# Patient Record
Sex: Male | Born: 1945 | Race: Black or African American | Hispanic: No | Marital: Married
Health system: Southern US, Community
[De-identification: ages and names within clinical notes are randomized; demographics above are authoritative.]

## PROBLEM LIST (undated history)

## (undated) DIAGNOSIS — I10 Essential (primary) hypertension: Secondary | ICD-10-CM

---

## 2004-06-10 ENCOUNTER — Emergency Department (HOSPITAL_COMMUNITY): Admission: EM | Admit: 2004-06-10 | Discharge: 2004-06-10 | Payer: Self-pay | Admitting: Emergency Medicine

## 2005-03-22 IMAGING — CT CT HEAD W/O CM
2 of 3 series · 14 of 33 positions shown, 18 images · non-contrast
Comparison: none

CLINICAL DATA: Syncopal episode.
 HEAD CT WITHOUT CONTRAST
 There is no evidence of intracranial hemorrhage, brain edema, or mass effect.  The ventricles are normal.  No extra-axial abnormalities are identified.  Bone windows show no significant abnormalities.
 IMPRESSION
 Negative non-contrast cranial CT.

[Series 2: — · sagittal · 0.44mm/px · 1 of 3 slices shown (1 of 2)]
[im 2/3  brain]
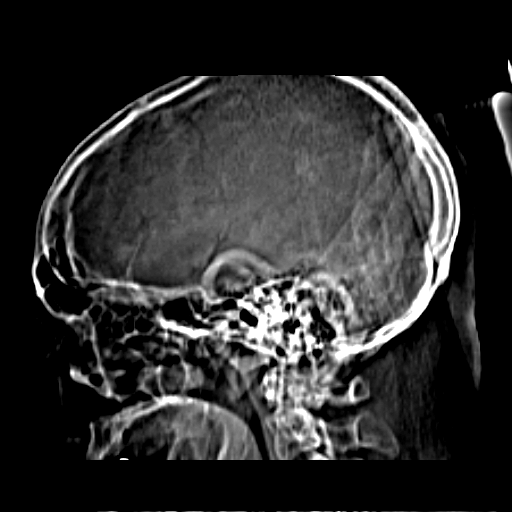

[Series 3: — · axial · 0.43mm/px · z∈[-146,-26]mm · 13 of 30 slices shown, 17 images (2 of 2)]
[im 3/30  brain]
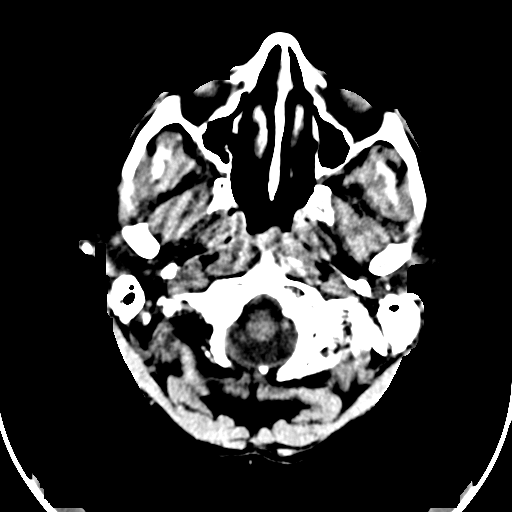
[im 3/30  bone]
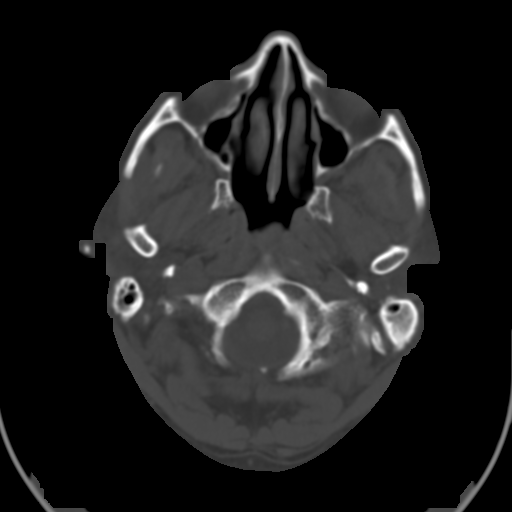
[im 5/30  brain]
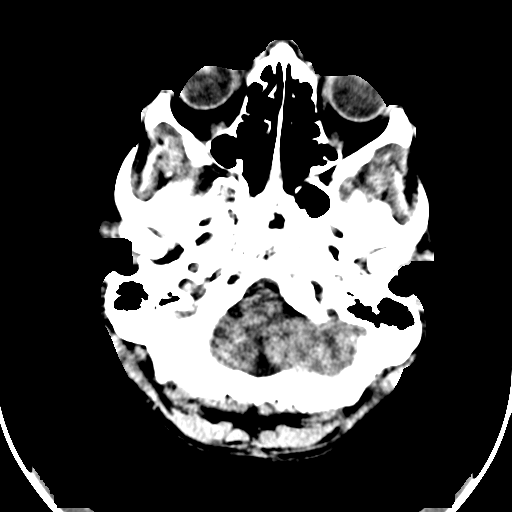
[im 7/30  brain]
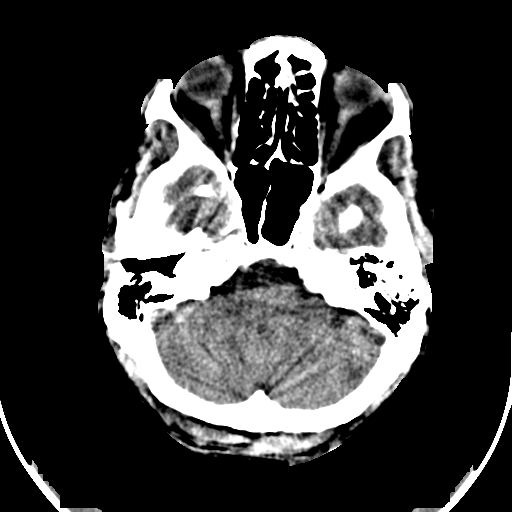
[im 9/30  brain]
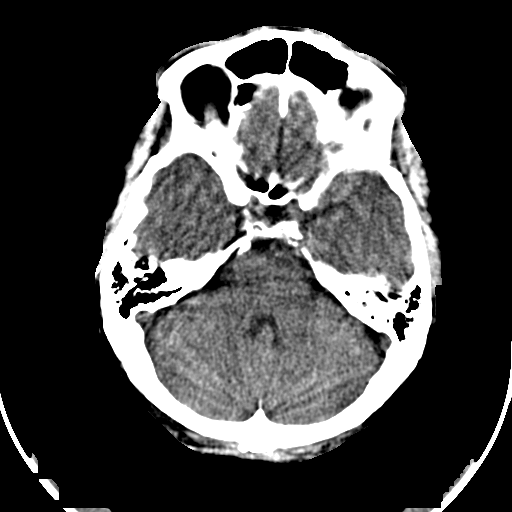
[im 11/30  brain]
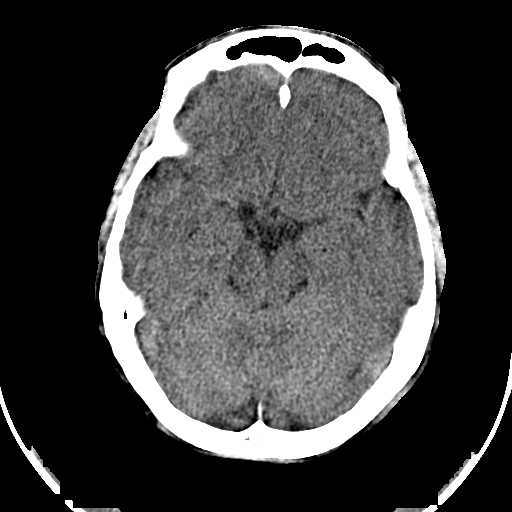
[im 11/30  bone]
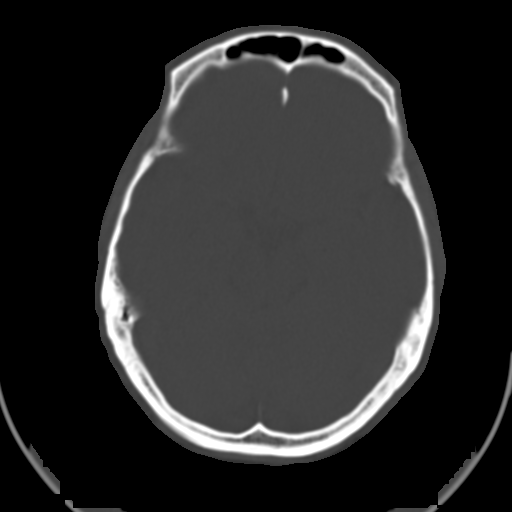
[im 13/30  brain]
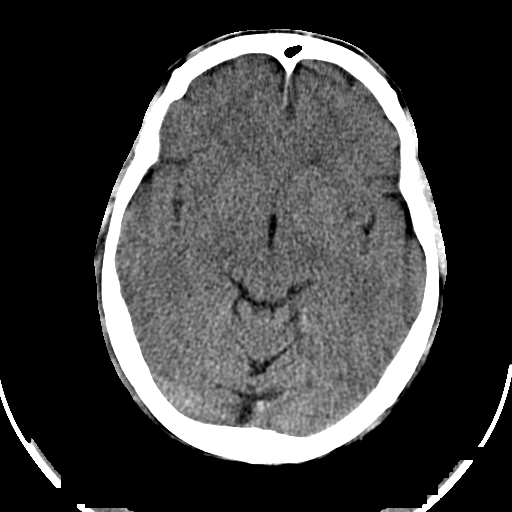
[im 15/30  brain]
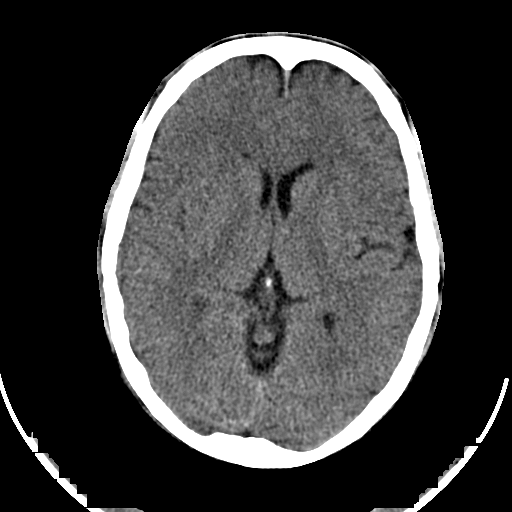
[im 17/30  brain]
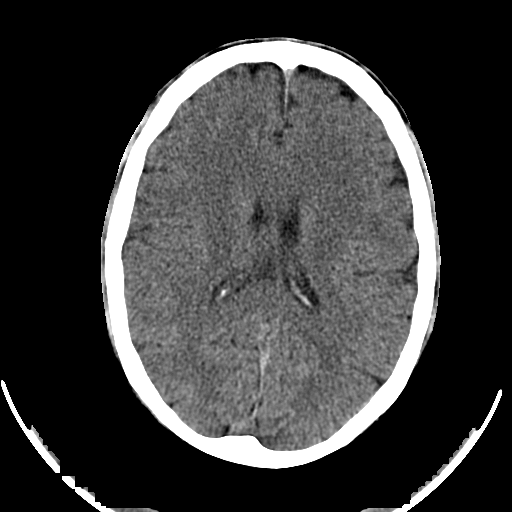
[im 19/30  brain]
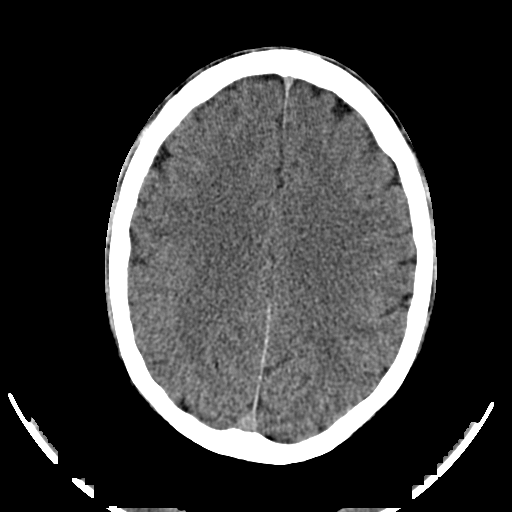
[im 19/30  bone]
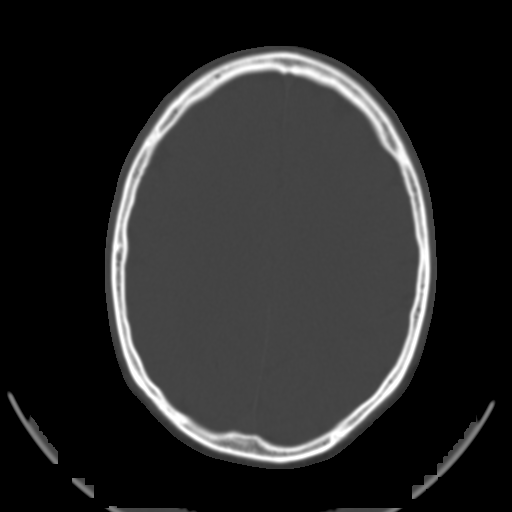
[im 21/30  brain]
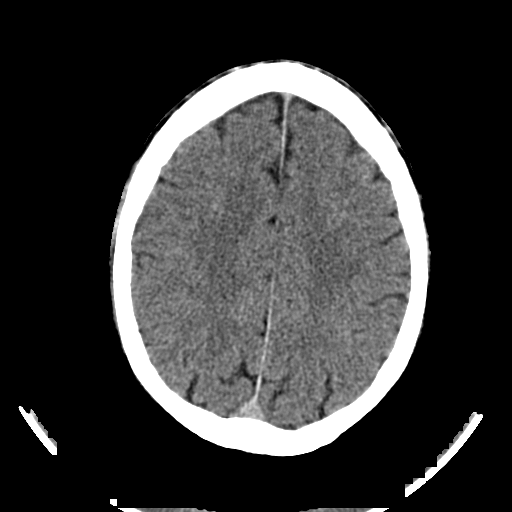
[im 23/30  brain]
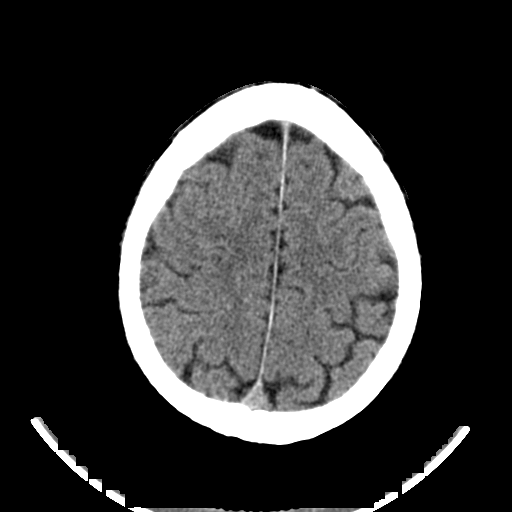
[im 25/30  brain]
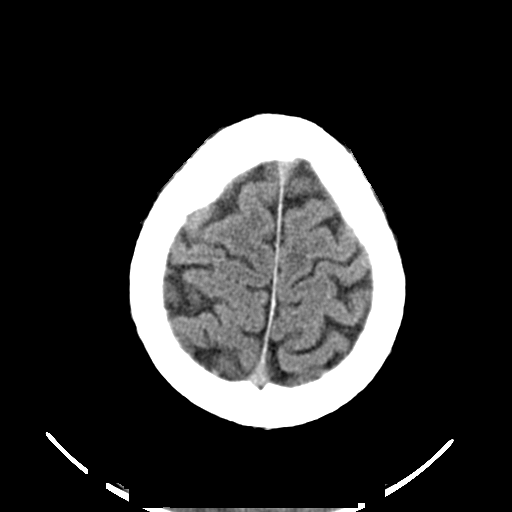
[im 27/30  brain]
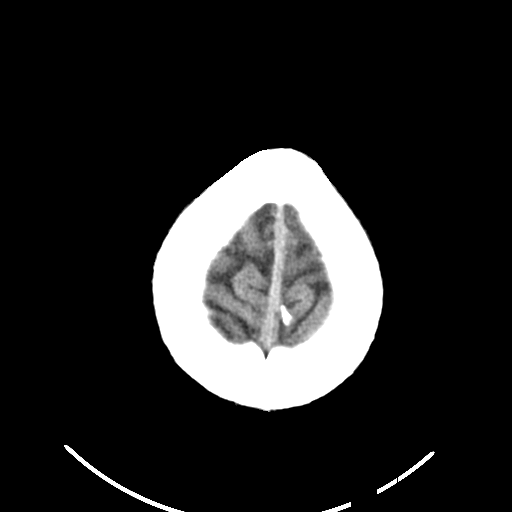
[im 27/30  bone]
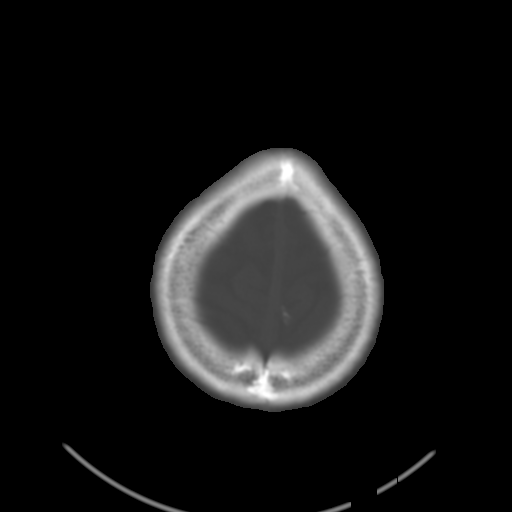

[14 of 33 positions shown; findings below may reference images not displayed]

## 2015-11-21 DIAGNOSIS — H401131 Primary open-angle glaucoma, bilateral, mild stage: Secondary | ICD-10-CM | POA: Diagnosis not present

## 2015-12-20 DIAGNOSIS — J301 Allergic rhinitis due to pollen: Secondary | ICD-10-CM | POA: Diagnosis not present

## 2015-12-20 DIAGNOSIS — R05 Cough: Secondary | ICD-10-CM | POA: Diagnosis not present

## 2016-01-10 DIAGNOSIS — H401130 Primary open-angle glaucoma, bilateral, stage unspecified: Secondary | ICD-10-CM | POA: Diagnosis not present

## 2016-04-10 DIAGNOSIS — H401131 Primary open-angle glaucoma, bilateral, mild stage: Secondary | ICD-10-CM | POA: Diagnosis not present

## 2016-06-17 DIAGNOSIS — Z23 Encounter for immunization: Secondary | ICD-10-CM | POA: Diagnosis not present

## 2016-07-26 DIAGNOSIS — R112 Nausea with vomiting, unspecified: Secondary | ICD-10-CM | POA: Diagnosis not present

## 2016-08-21 DIAGNOSIS — E7800 Pure hypercholesterolemia, unspecified: Secondary | ICD-10-CM | POA: Diagnosis not present

## 2016-08-21 DIAGNOSIS — I10 Essential (primary) hypertension: Secondary | ICD-10-CM | POA: Diagnosis not present

## 2016-08-21 DIAGNOSIS — Z23 Encounter for immunization: Secondary | ICD-10-CM | POA: Diagnosis not present

## 2016-08-21 DIAGNOSIS — Z8739 Personal history of other diseases of the musculoskeletal system and connective tissue: Secondary | ICD-10-CM | POA: Diagnosis not present

## 2016-08-21 DIAGNOSIS — Z0000 Encounter for general adult medical examination without abnormal findings: Secondary | ICD-10-CM | POA: Diagnosis not present

## 2016-10-17 DIAGNOSIS — H401131 Primary open-angle glaucoma, bilateral, mild stage: Secondary | ICD-10-CM | POA: Diagnosis not present

## 2017-01-21 DIAGNOSIS — H401131 Primary open-angle glaucoma, bilateral, mild stage: Secondary | ICD-10-CM | POA: Diagnosis not present

## 2017-04-02 DEATH — deceased

## 2017-04-23 DIAGNOSIS — H401131 Primary open-angle glaucoma, bilateral, mild stage: Secondary | ICD-10-CM | POA: Diagnosis not present

## 2017-06-06 DIAGNOSIS — Z23 Encounter for immunization: Secondary | ICD-10-CM | POA: Diagnosis not present

## 2017-07-23 DIAGNOSIS — H401131 Primary open-angle glaucoma, bilateral, mild stage: Secondary | ICD-10-CM | POA: Diagnosis not present

## 2017-08-14 DIAGNOSIS — I10 Essential (primary) hypertension: Secondary | ICD-10-CM | POA: Diagnosis not present

## 2017-08-14 DIAGNOSIS — M109 Gout, unspecified: Secondary | ICD-10-CM | POA: Diagnosis not present

## 2017-08-14 DIAGNOSIS — E7800 Pure hypercholesterolemia, unspecified: Secondary | ICD-10-CM | POA: Diagnosis not present

## 2017-08-28 DIAGNOSIS — Z23 Encounter for immunization: Secondary | ICD-10-CM | POA: Diagnosis not present

## 2017-08-28 DIAGNOSIS — I10 Essential (primary) hypertension: Secondary | ICD-10-CM | POA: Diagnosis not present

## 2017-08-28 DIAGNOSIS — Z0000 Encounter for general adult medical examination without abnormal findings: Secondary | ICD-10-CM | POA: Diagnosis not present

## 2017-12-02 DIAGNOSIS — H401131 Primary open-angle glaucoma, bilateral, mild stage: Secondary | ICD-10-CM | POA: Diagnosis not present

## 2018-03-11 DIAGNOSIS — H401131 Primary open-angle glaucoma, bilateral, mild stage: Secondary | ICD-10-CM | POA: Diagnosis not present

## 2018-06-29 DIAGNOSIS — Z23 Encounter for immunization: Secondary | ICD-10-CM | POA: Diagnosis not present

## 2018-07-21 DIAGNOSIS — H401131 Primary open-angle glaucoma, bilateral, mild stage: Secondary | ICD-10-CM | POA: Diagnosis not present

## 2018-08-21 DIAGNOSIS — H401134 Primary open-angle glaucoma, bilateral, indeterminate stage: Secondary | ICD-10-CM | POA: Diagnosis not present

## 2018-09-03 DIAGNOSIS — E7800 Pure hypercholesterolemia, unspecified: Secondary | ICD-10-CM | POA: Diagnosis not present

## 2018-09-03 DIAGNOSIS — Z0000 Encounter for general adult medical examination without abnormal findings: Secondary | ICD-10-CM | POA: Diagnosis not present

## 2018-09-03 DIAGNOSIS — I10 Essential (primary) hypertension: Secondary | ICD-10-CM | POA: Diagnosis not present

## 2018-09-03 DIAGNOSIS — M109 Gout, unspecified: Secondary | ICD-10-CM | POA: Diagnosis not present

## 2018-09-03 DIAGNOSIS — Z125 Encounter for screening for malignant neoplasm of prostate: Secondary | ICD-10-CM | POA: Diagnosis not present

## 2019-05-26 ENCOUNTER — Ambulatory Visit
Admission: RE | Admit: 2019-05-26 | Discharge: 2019-05-26 | Disposition: A | Discharge status: Home / Self Care | Payer: Self-pay | Source: Ambulatory Visit | Attending: Family Medicine | Admitting: Family Medicine

## 2019-05-26 ENCOUNTER — Other Ambulatory Visit: Payer: Self-pay

## 2019-05-26 ENCOUNTER — Other Ambulatory Visit: Payer: Self-pay | Admitting: Family Medicine

## 2019-05-26 DIAGNOSIS — Z23 Encounter for immunization: Secondary | ICD-10-CM | POA: Diagnosis not present

## 2019-05-26 DIAGNOSIS — R490 Dysphonia: Secondary | ICD-10-CM | POA: Diagnosis not present

## 2019-05-26 DIAGNOSIS — R634 Abnormal weight loss: Secondary | ICD-10-CM

## 2019-06-03 DIAGNOSIS — J387 Other diseases of larynx: Secondary | ICD-10-CM | POA: Diagnosis not present

## 2019-06-03 DIAGNOSIS — J383 Other diseases of vocal cords: Secondary | ICD-10-CM | POA: Diagnosis not present

## 2019-06-03 DIAGNOSIS — J302 Other seasonal allergic rhinitis: Secondary | ICD-10-CM | POA: Diagnosis not present

## 2019-06-21 DIAGNOSIS — H401131 Primary open-angle glaucoma, bilateral, mild stage: Secondary | ICD-10-CM | POA: Diagnosis not present

## 2019-09-06 DIAGNOSIS — I10 Essential (primary) hypertension: Secondary | ICD-10-CM | POA: Diagnosis not present

## 2019-09-06 DIAGNOSIS — E7800 Pure hypercholesterolemia, unspecified: Secondary | ICD-10-CM | POA: Diagnosis not present

## 2019-09-07 DIAGNOSIS — Z0000 Encounter for general adult medical examination without abnormal findings: Secondary | ICD-10-CM | POA: Diagnosis not present

## 2019-09-07 DIAGNOSIS — E785 Hyperlipidemia, unspecified: Secondary | ICD-10-CM | POA: Diagnosis not present

## 2019-09-07 DIAGNOSIS — I10 Essential (primary) hypertension: Secondary | ICD-10-CM | POA: Diagnosis not present

## 2019-09-21 DIAGNOSIS — H401131 Primary open-angle glaucoma, bilateral, mild stage: Secondary | ICD-10-CM | POA: Diagnosis not present

## 2019-10-01 ENCOUNTER — Ambulatory Visit: Payer: HMO

## 2019-10-06 ENCOUNTER — Ambulatory Visit: Payer: HMO

## 2019-10-07 ENCOUNTER — Ambulatory Visit: Payer: PPO | Attending: Internal Medicine

## 2019-10-07 DIAGNOSIS — Z23 Encounter for immunization: Secondary | ICD-10-CM | POA: Insufficient documentation

## 2019-10-07 NOTE — Progress Notes (Signed)
   Covid-19 Vaccination Clinic  Name:  MARQUINN MESCHKE    MRN: [id number] DOB: 1946/03/14  10/07/2019  Mr. Beagley was observed post Covid-19 immunization for 15 minutes without incidence. He was provided with Vaccine Information Sheet and instruction to access the V-Safe system.   Mr. Funes was instructed to call 911 with any severe reactions post vaccine: Marland Kitchen Difficulty breathing  . Swelling of your face and throat  . A fast heartbeat  . A bad rash all over your body  . Dizziness and weakness    Immunizations Administered    Name Date Dose VIS Date Route   Pfizer COVID-19 Vaccine 10/07/2019  2:54 PM 0.3 mL 08/13/2019 Intramuscular   Manufacturer: [employer], [employer]   Lot: [id number]   NDC: [address]-2

## 2019-11-01 ENCOUNTER — Ambulatory Visit: Payer: PPO | Attending: Internal Medicine

## 2019-11-01 DIAGNOSIS — Z23 Encounter for immunization: Secondary | ICD-10-CM | POA: Insufficient documentation

## 2019-11-01 NOTE — Progress Notes (Signed)
   Covid-19 Vaccination Clinic  Name:  BOWYN MERCIER    MRN: [id number] DOB: [birth year]  11/01/2019  Mr. Milhouse was observed post Covid-19 immunization for 15 minutes without incidence. He was provided with Vaccine Information Sheet and instruction to access the V-Safe system.   Mr. Kalmar was instructed to call 911 with any severe reactions post vaccine: Marland Kitchen Difficulty breathing  . Swelling of your face and throat  . A fast heartbeat  . A bad rash all over your body  . Dizziness and weakness    Immunizations Administered    Name Date Dose VIS Date Route   Pfizer COVID-19 Vaccine 11/01/2019  4:46 PM 0.3 mL 08/13/2019 Intramuscular   Manufacturer: [employer], [employer]   Lot: [id number]   NDC: [address]-2

## 2020-01-19 DIAGNOSIS — H401131 Primary open-angle glaucoma, bilateral, mild stage: Secondary | ICD-10-CM | POA: Diagnosis not present

## 2020-03-06 IMAGING — CR DG CHEST 2V
2 series · 2 of 2 positions shown · non-contrast
Comparison: None.

CLINICAL DATA: Unintended weight loss (10+ lbs.) X 2 months Hx:
HTN, non-smoker

EXAM:
CHEST - 2 VIEW

[w chest pa]
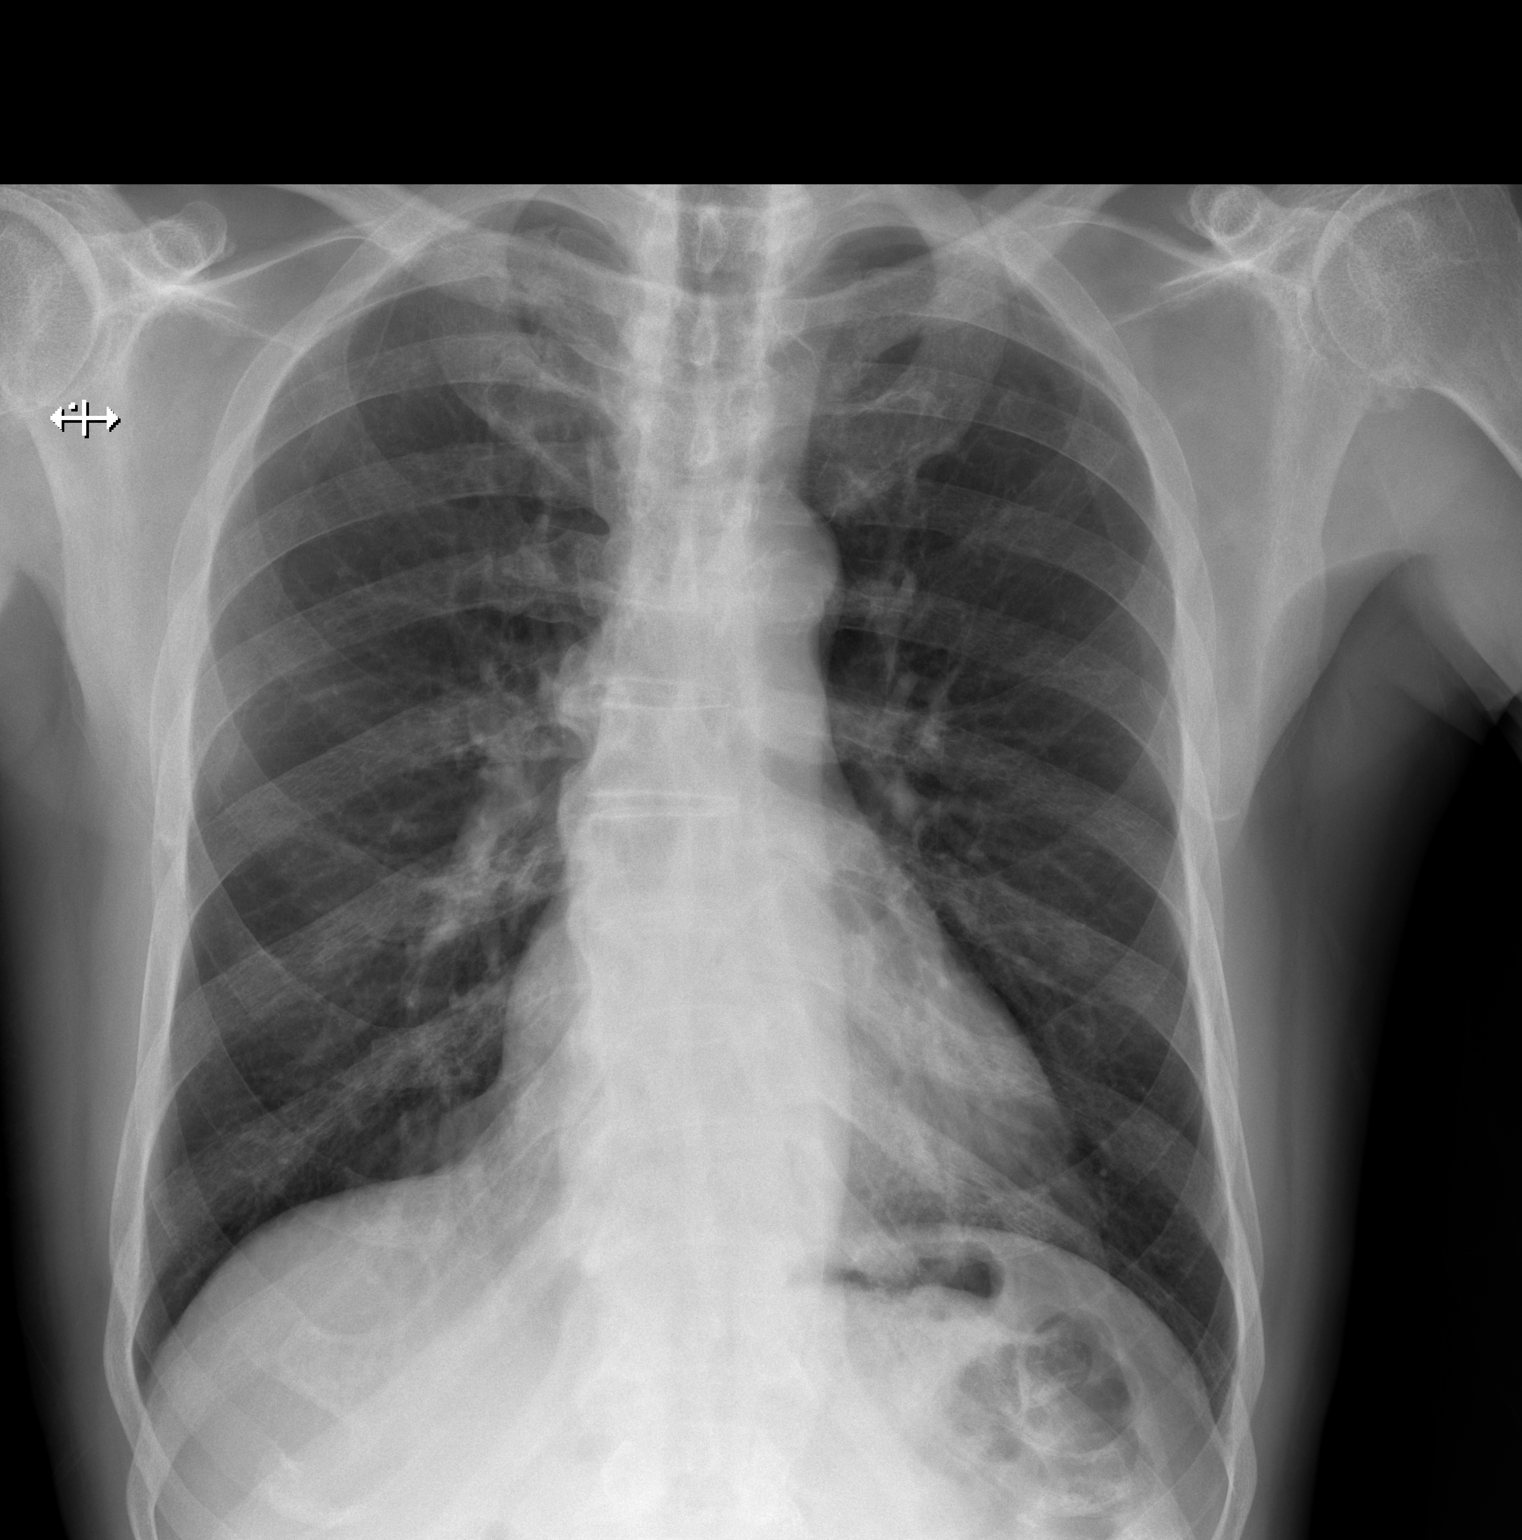

[w chest lat]
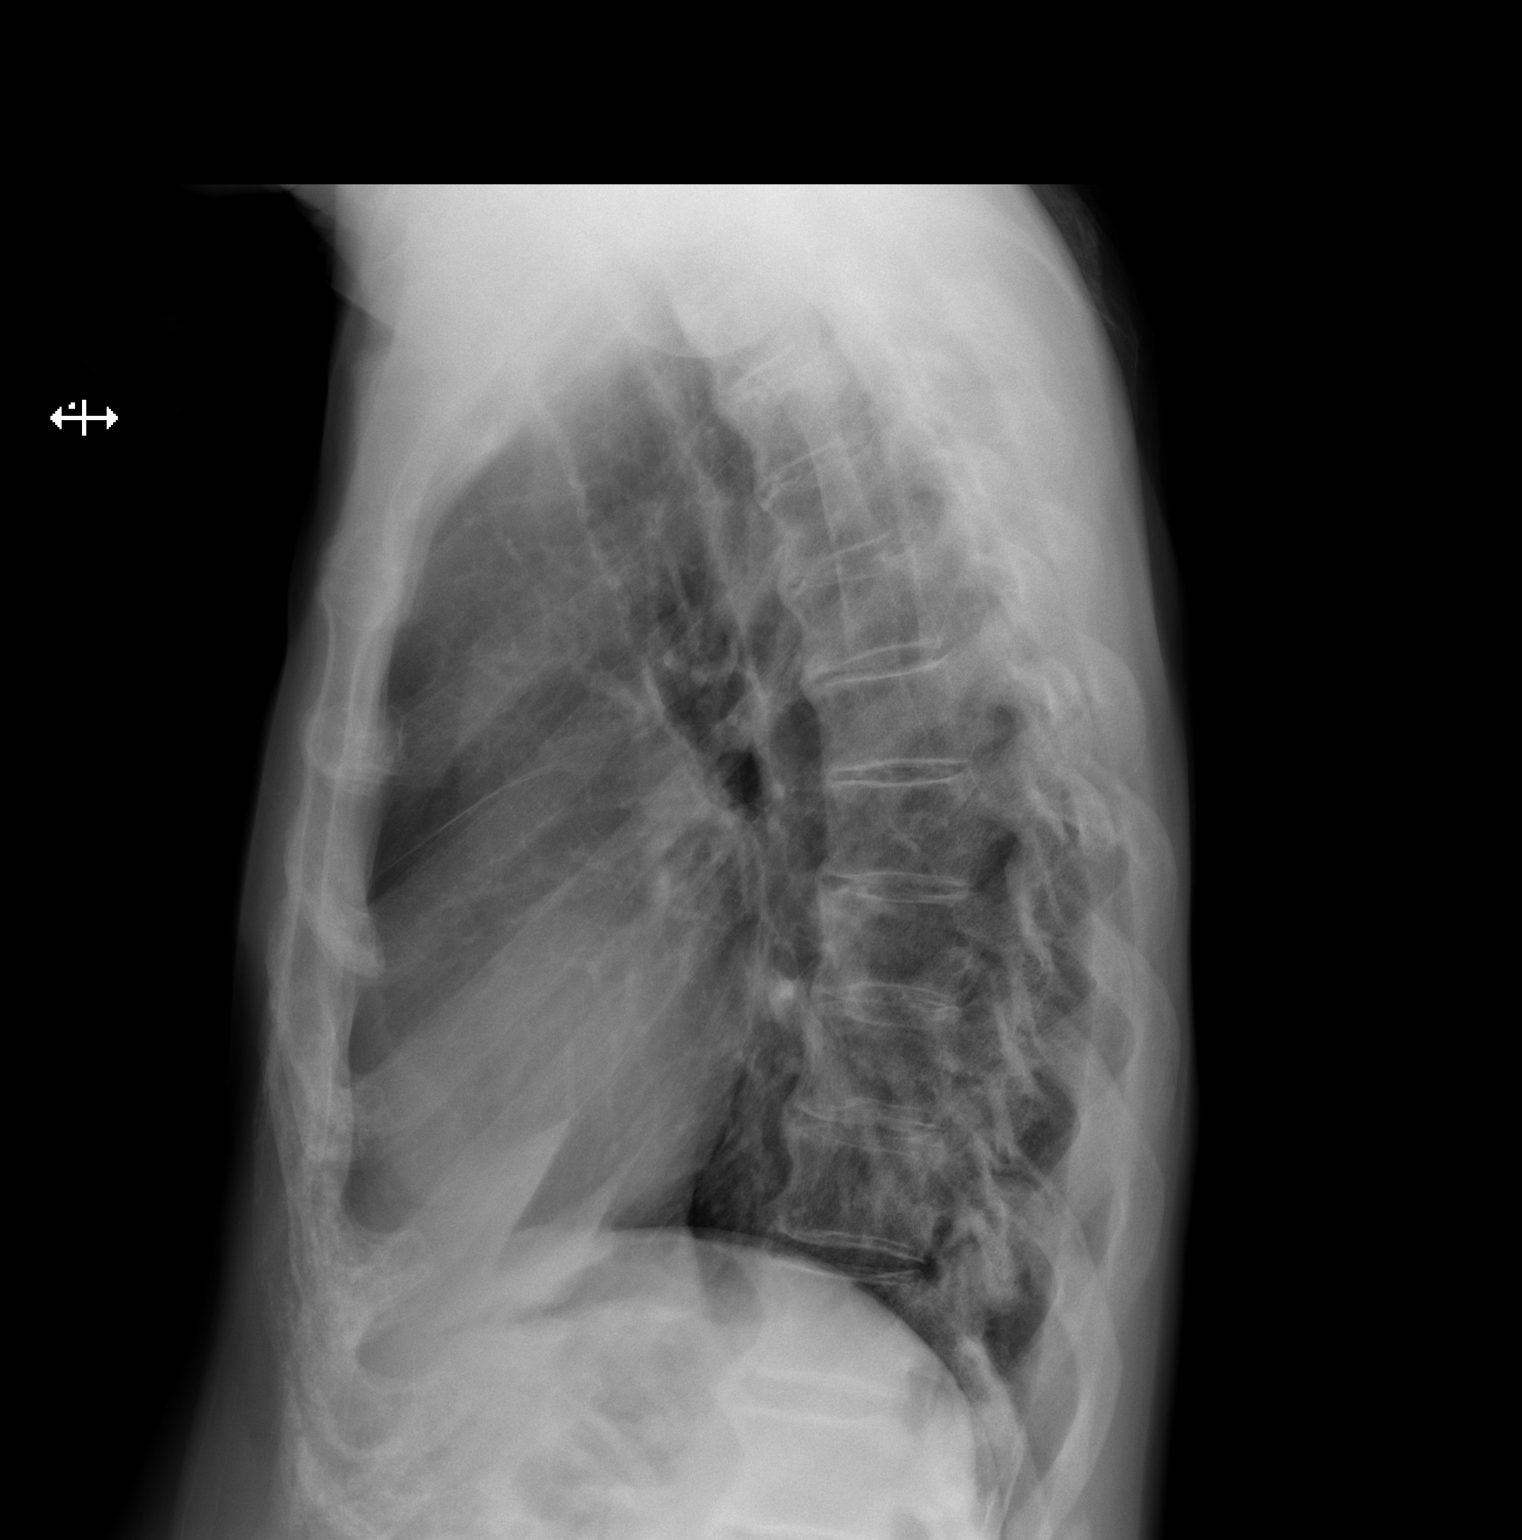

[2 of 2 positions shown; findings below may reference images not displayed]

FINDINGS: The heart size and mediastinal contours are within normal limits.
The lungs are clear. No pneumothorax or pleural effusion. Mild
degenerative changes are seen in the thoracic spine.
IMPRESSION: No active cardiopulmonary process.

## 2020-05-22 DIAGNOSIS — H401131 Primary open-angle glaucoma, bilateral, mild stage: Secondary | ICD-10-CM | POA: Diagnosis not present

## 2020-06-15 DIAGNOSIS — Z23 Encounter for immunization: Secondary | ICD-10-CM | POA: Diagnosis not present

## 2020-09-06 DIAGNOSIS — R634 Abnormal weight loss: Secondary | ICD-10-CM | POA: Diagnosis not present

## 2020-09-06 DIAGNOSIS — I10 Essential (primary) hypertension: Secondary | ICD-10-CM | POA: Diagnosis not present

## 2020-09-06 DIAGNOSIS — Z0000 Encounter for general adult medical examination without abnormal findings: Secondary | ICD-10-CM | POA: Diagnosis not present

## 2020-09-06 DIAGNOSIS — E7800 Pure hypercholesterolemia, unspecified: Secondary | ICD-10-CM | POA: Diagnosis not present

## 2020-09-06 DIAGNOSIS — N529 Male erectile dysfunction, unspecified: Secondary | ICD-10-CM | POA: Diagnosis not present

## 2020-09-25 DIAGNOSIS — H401131 Primary open-angle glaucoma, bilateral, mild stage: Secondary | ICD-10-CM | POA: Diagnosis not present

## 2021-01-01 DIAGNOSIS — H401131 Primary open-angle glaucoma, bilateral, mild stage: Secondary | ICD-10-CM | POA: Diagnosis not present

## 2021-05-21 DIAGNOSIS — I10 Essential (primary) hypertension: Secondary | ICD-10-CM | POA: Diagnosis not present

## 2021-05-21 DIAGNOSIS — Z23 Encounter for immunization: Secondary | ICD-10-CM | POA: Diagnosis not present

## 2021-05-21 DIAGNOSIS — Z125 Encounter for screening for malignant neoplasm of prostate: Secondary | ICD-10-CM | POA: Diagnosis not present

## 2021-05-21 DIAGNOSIS — R634 Abnormal weight loss: Secondary | ICD-10-CM | POA: Diagnosis not present

## 2021-05-23 DIAGNOSIS — H401131 Primary open-angle glaucoma, bilateral, mild stage: Secondary | ICD-10-CM | POA: Diagnosis not present

## 2021-06-05 DIAGNOSIS — R634 Abnormal weight loss: Secondary | ICD-10-CM | POA: Diagnosis not present

## 2021-06-05 DIAGNOSIS — R6881 Early satiety: Secondary | ICD-10-CM | POA: Diagnosis not present

## 2021-07-13 DIAGNOSIS — R634 Abnormal weight loss: Secondary | ICD-10-CM | POA: Diagnosis not present

## 2021-07-13 DIAGNOSIS — Z1211 Encounter for screening for malignant neoplasm of colon: Secondary | ICD-10-CM | POA: Diagnosis not present

## 2021-07-13 DIAGNOSIS — K641 Second degree hemorrhoids: Secondary | ICD-10-CM | POA: Diagnosis not present

## 2021-07-13 DIAGNOSIS — K5730 Diverticulosis of large intestine without perforation or abscess without bleeding: Secondary | ICD-10-CM | POA: Diagnosis not present

## 2021-07-13 DIAGNOSIS — K2950 Unspecified chronic gastritis without bleeding: Secondary | ICD-10-CM | POA: Diagnosis not present

## 2021-07-13 DIAGNOSIS — A048 Other specified bacterial intestinal infections: Secondary | ICD-10-CM | POA: Diagnosis not present

## 2021-07-13 DIAGNOSIS — B9681 Helicobacter pylori [H. pylori] as the cause of diseases classified elsewhere: Secondary | ICD-10-CM | POA: Diagnosis not present

## 2021-07-13 DIAGNOSIS — R933 Abnormal findings on diagnostic imaging of other parts of digestive tract: Secondary | ICD-10-CM | POA: Diagnosis not present

## 2021-08-16 DIAGNOSIS — R918 Other nonspecific abnormal finding of lung field: Secondary | ICD-10-CM | POA: Diagnosis not present

## 2021-08-16 DIAGNOSIS — A048 Other specified bacterial intestinal infections: Secondary | ICD-10-CM | POA: Diagnosis not present

## 2021-08-16 DIAGNOSIS — R634 Abnormal weight loss: Secondary | ICD-10-CM | POA: Diagnosis not present

## 2023-10-05 ENCOUNTER — Emergency Department (HOSPITAL_COMMUNITY)
Admission: EM | Admit: 2023-10-05 | Discharge: 2023-10-05 | Disposition: A | Discharge status: Home / Self Care | Payer: PPO | Attending: Emergency Medicine | Admitting: Emergency Medicine

## 2023-10-05 ENCOUNTER — Emergency Department (HOSPITAL_COMMUNITY): Payer: PPO

## 2023-10-05 ENCOUNTER — Encounter (HOSPITAL_COMMUNITY): Payer: Self-pay

## 2023-10-05 DIAGNOSIS — Z1152 Encounter for screening for COVID-19: Secondary | ICD-10-CM | POA: Diagnosis not present

## 2023-10-05 DIAGNOSIS — R55 Syncope and collapse: Secondary | ICD-10-CM | POA: Insufficient documentation

## 2023-10-05 DIAGNOSIS — I10 Essential (primary) hypertension: Secondary | ICD-10-CM | POA: Insufficient documentation

## 2023-10-05 HISTORY — DX: Essential (primary) hypertension: I10

## 2023-10-05 LAB — CBC WITH DIFFERENTIAL/PLATELET
Abs Immature Granulocytes: 0.01 10*3/uL (ref 0.00–0.07)
Basophils Absolute: 0 10*3/uL (ref 0.0–0.1)
Basophils Relative: 1 %
Eosinophils Absolute: 0.1 10*3/uL (ref 0.0–0.5)
Eosinophils Relative: 4 %
HCT: 34.5 % — ABNORMAL LOW (ref 39.0–52.0)
Hemoglobin: 11 g/dL — ABNORMAL LOW (ref 13.0–17.0)
Immature Granulocytes: 0 %
Lymphocytes Relative: 28 %
Lymphs Abs: 0.7 10*3/uL (ref 0.7–4.0)
MCH: 29.6 pg (ref 26.0–34.0)
MCHC: 31.9 g/dL (ref 30.0–36.0)
MCV: 93 fL (ref 80.0–100.0)
Monocytes Absolute: 0.3 10*3/uL (ref 0.1–1.0)
Monocytes Relative: 12 %
Neutro Abs: 1.3 10*3/uL — ABNORMAL LOW (ref 1.7–7.7)
Neutrophils Relative %: 55 %
Platelets: 121 10*3/uL — ABNORMAL LOW (ref 150–400)
RBC: 3.71 MIL/uL — ABNORMAL LOW (ref 4.22–5.81)
RDW: 12.5 % (ref 11.5–15.5)
WBC: 2.4 10*3/uL — ABNORMAL LOW (ref 4.0–10.5)
nRBC: 0 % (ref 0.0–0.2)

## 2023-10-05 LAB — RESP PANEL BY RT-PCR (RSV, FLU A&B, COVID)  RVPGX2
Influenza A by PCR: NEGATIVE
Influenza B by PCR: NEGATIVE
Resp Syncytial Virus by PCR: NEGATIVE
SARS Coronavirus 2 by RT PCR: NEGATIVE

## 2023-10-05 LAB — BASIC METABOLIC PANEL
Anion gap: 8 (ref 5–15)
BUN: 11 mg/dL (ref 8–23)
CO2: 28 mmol/L (ref 22–32)
Calcium: 8.2 mg/dL — ABNORMAL LOW (ref 8.9–10.3)
Chloride: 105 mmol/L (ref 98–111)
Creatinine, Ser: 1.08 mg/dL (ref 0.61–1.24)
GFR, Estimated: >60 mL/min (ref >60)
Glucose, Bld: 98 mg/dL (ref 70–99)
Potassium: 3.9 mmol/L (ref 3.5–5.1)
Sodium: 141 mmol/L (ref 135–145)

## 2023-10-05 LAB — TROPONIN I (HIGH SENSITIVITY)
Troponin I (High Sensitivity): 3 ng/L (ref <18)
Troponin I (High Sensitivity): 4 ng/L (ref <18)

## 2023-10-05 LAB — MAGNESIUM: Magnesium: 1.9 mg/dL (ref 1.7–2.4)

## 2023-10-05 NOTE — ED Triage Notes (Signed)
Pt BIB [facility] from home after a near-syncope event. Per EMS pt was diaphoretic and dizzy; BP 80/50 HR 50s. Pt denies CP or SHOB. EMS orthostatic BP dropped to 60s sys after standing, HR dropped to 30s. Hx of HTN but no other significant cardiac hx. Family member reports similar event happened 7 yrs ago. Pt BP upon fluid bolus 150/78 per EMS.

## 2023-10-05 NOTE — ED Provider Notes (Signed)
[facility] AT [facility] Provider Note   CSN: [id number] Arrival date & time: 10/05/23  1117     History {Add pertinent medical, surgical, social history, OB history to HPI:1} Chief Complaint  Patient presents with   Near Syncope    Bruce Mccarty is a 78 y.o. male presenting to the ED with concern for low blood pressure and diaphoresis.  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  EMS was called out onto the scene.  They reported that the patient's initial blood pressure was 80/50 with a heart rate in the 50s.  When they performed orthostatic vital signs systolic pressure dropped to the 60s and the patient was very dizzy, with heart rate into the 30s.  They gave the patient 800 cc of fluid en route to the hospital.  The patient says he feels fine now, denies lightheadedness, denies having any chest pain today.  He says he felt fine yesterday.  Denies any recent flu or infectious symptoms.  His wife and daughter present at the bedside.  They report he had a similar episode maybe 7 years ago when he became lightheaded and "sick on his stomach".  He denies any known coronary history.  Denies history of smoking.  Reports he does drink water regularly and does not feel that he is dehydrated.  He reports that he usually takes HCTZ for high blood pressure.  He is not on any beta-blockers.  HPI     Home Medications Prior to Admission medications   Not on File      Allergies    Patient has no allergy information on record.    Review of Systems   Review of Systems  Physical Exam Updated Vital Signs BP (!) 143/72 (BP Location: Right Arm)   Pulse 64   Temp (!) 97.5 F (36.4 C) (Oral)   Resp 15   Ht 6\' 2"  (1.88 m)   Wt 66.7 kg   SpO2 98%   BMI 18.87 kg/m  Physical Exam Constitutional:      General: He is not in acute distress. HENT:     Head:  Normocephalic and atraumatic.  Eyes:     Conjunctiva/sclera: Conjunctivae normal.     Pupils: Pupils are equal, round, and reactive to light.  Cardiovascular:     Rate and Rhythm: Normal rate and regular rhythm.  Pulmonary:     Effort: Pulmonary effort is normal. No respiratory distress.  Abdominal:     General: There is no distension.     Tenderness: There is no abdominal tenderness.  Skin:    General: Skin is warm and dry.  Neurological:     General: No focal deficit present.     Mental Status: He is alert. Mental status is at baseline.  Psychiatric:        Mood and Affect: Mood normal.        Behavior: Behavior normal.     ED Results / Procedures / Treatments   Labs (all labs ordered are listed, but only abnormal results are displayed) Labs Reviewed - No data to display  EKG None  Radiology No results found.  Procedures Procedures  {Document cardiac monitor, telemetry assessment procedure when appropriate:1}  Medications Ordered in ED Medications - No data to display  ED Course/ Medical Decision Making/ A&P   {   Click here for ABCD2,  HEART and other calculatorsREFRESH Note before signing :1}                              Medical Decision Making Amount and/or Complexity of Data Reviewed Labs: ordered. Radiology: ordered.   This patient presents to the ED with concern for near syncope, hypotension. This involves an extensive number of treatment options, and is a complaint that carries with it a high risk of complications and morbidity.  The differential diagnosis includes orthostatic hypotension versus arrhythmia versus anemia versus dehydration versus other  Co-morbidities that complicate the patient evaluation: Age is a cardiovascular risk factor, high blood pressure  Additional history obtained from EMS, patient's family at bedside  External records from outside source obtained and reviewed including CT scan of the chest in August 2023 on [facility],  noting mild to moderate coronary artery calcifications, no report of aortic aneurysm at this time.  Small focal tree-in-bud opacities of the lungs.  I ordered and personally interpreted labs.  The pertinent results include:  ***  I ordered imaging studies including x-ray of the chest I independently visualized and interpreted imaging which showed *** I agree with the radiologist interpretation  The patient was maintained on a cardiac monitor.  I personally viewed and interpreted the cardiac monitored which showed an underlying rhythm of: Sinus rhythm  Per my interpretation the patient's ECG shows sinus rhythm  I ordered medication including ***  for ***  I have reviewed the patients home medicines and have made adjustments as needed  Test Considered: ***  I requested consultation with the ***,  and discussed lab and imaging findings as well as pertinent plan - they recommend: ***  After the interventions noted above, I reevaluated the patient and found that they have: {resolved/improved/worsened:23923::"improved"}  Social Determinants of Health:***  Dispostion:  After consideration of the diagnostic results and the patients response to treatment, I feel that the patent would benefit from ***.   {Document critical care time when appropriate:1} {Document review of labs and clinical decision tools ie heart score, Chads2Vasc2 etc:1}  {Document your independent review of radiology images, and any outside records:1} {Document your discussion with family members, caretakers, and with consultants:1} {Document social determinants of health affecting pt's care:1} {Document your decision making why or why not admission, treatments were needed:1} Final Clinical Impression(s) / ED Diagnoses Final diagnoses:  None    Rx / DC Orders ED Discharge Orders     None
